# Patient Record
Sex: Male | Born: 1990 | Race: White | Hispanic: No | Marital: Single | Smoking: Never smoker
Health system: Southern US, Community
[De-identification: ages and names within clinical notes are randomized; demographics above are authoritative.]

## PROBLEM LIST (undated history)

## (undated) DIAGNOSIS — M6701 Short Achilles tendon (acquired), right ankle: Secondary | ICD-10-CM

## (undated) DIAGNOSIS — Z98811 Dental restoration status: Secondary | ICD-10-CM

## (undated) DIAGNOSIS — M76821 Posterior tibial tendinitis, right leg: Secondary | ICD-10-CM

## (undated) DIAGNOSIS — Q742 Other congenital malformations of lower limb(s), including pelvic girdle: Secondary | ICD-10-CM

## (undated) HISTORY — PX: NO PAST SURGERIES: SHX2092

---
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
## 2011-08-14 ENCOUNTER — Emergency Department (INDEPENDENT_AMBULATORY_CARE_PROVIDER_SITE_OTHER): Payer: Managed Care, Other (non HMO)

## 2011-08-14 ENCOUNTER — Emergency Department (INDEPENDENT_AMBULATORY_CARE_PROVIDER_SITE_OTHER)
Admission: EM | Admit: 2011-08-14 | Discharge: 2011-08-14 | Disposition: A | Discharge status: Home / Self Care | Payer: Managed Care, Other (non HMO) | Source: Home / Self Care | Attending: Emergency Medicine | Admitting: Emergency Medicine

## 2011-08-14 ENCOUNTER — Encounter (HOSPITAL_COMMUNITY): Payer: Self-pay

## 2011-08-14 DIAGNOSIS — S92253A Displaced fracture of navicular [scaphoid] of unspecified foot, initial encounter for closed fracture: Secondary | ICD-10-CM

## 2011-08-14 DIAGNOSIS — S92009A Unspecified fracture of unspecified calcaneus, initial encounter for closed fracture: Secondary | ICD-10-CM

## 2011-08-14 DIAGNOSIS — S92256A Nondisplaced fracture of navicular [scaphoid] of unspecified foot, initial encounter for closed fracture: Secondary | ICD-10-CM

## 2011-08-14 MED ORDER — HYDROCODONE-ACETAMINOPHEN 5-325 MG PO TABS: ORAL_TABLET | ORAL | Status: AC

## 2011-08-14 NOTE — Progress Notes (Signed)
Orthopedic Tech Progress Note Patient Details:  Paul Hooper 08/23/1990 [id number]  Type of Splint: Post (short) Splint Location: right leg Splint Interventions: Application    Nikki Dom 08/14/2011, 2:23 PM

## 2011-08-14 NOTE — ED Notes (Signed)
States  He twisted his right ankle while playing basketball yest PM,pain lateral aspect, worse w palpation, ROM or weight bearing; minimal swelling noted; good DP and post tibial pulses on palpation; used motrin x 1 last PM w minimal relief

## 2011-08-14 NOTE — Discharge Instructions (Signed)
Cast or Splint Care Casts and splints support injured limbs and keep bones from moving while they heal.  HOME CARE  Keep the cast or splint uncovered during the drying period.   A plaster cast can take 24 to 48 hours to dry.   A fiberglass cast will dry in less than 1 hour.   Do not rest the cast on anything harder than a pillow for 24 hours.   Do not put weight on your injured limb. Do not put pressure on the cast. Wait for your doctor's approval.   Keep the cast or splint dry.   Cover the cast or splint with a plastic bag during baths or wet weather.   If you have a cast over your chest and belly (trunk), take sponge baths until the cast is taken off.   Keep your cast or splint clean. Wash a dirty cast with a damp cloth.   Do not put any objects under your cast or splint. Do not scratch the skin under the cast with an object.   Do not take out the padding from inside your cast.   Exercise your joints near the cast as told by your doctor.   Raise (elevate) your injured limb on 1 or 2 pillows for the first 1 to 3 days.  GET HELP RIGHT AWAY IF:  Your cast or splint cracks.   Your cast or splint is too tight or too loose.   You itch badly under the cast.   Your cast gets wet or has a soft spot.   You have a bad smell coming from the cast.   You get an object stuck under the cast.   Your skin around the cast becomes red or raw.   You have new or more pain after the cast is put on.   You have fluid leaking through the cast.   You cannot move your fingers or toes.   Your fingers or toes turn colors or are cool, painful, or puffy (swollen).   You have tingling or lose feeling (numbness) around the injured area.   You have pain or pressure under the cast.   You have trouble breathing or have shortness of breath.   You have chest pain.  MAKE SURE YOU:  Understand these instructions.   Will watch your condition.   Will get help right away if you are not doing  well or get worse.  Document Released: 08/10/2010 Document Revised: 03/30/2011 Document Reviewed: 08/10/2010 [facility] Patient Information 2012 [place], [place].Avulsion Fracture You have an avulsion fracture. Avulsion fractures are chips of bone pulled off by muscle tendons or ligaments. Common avulsion fractures are on the hand and foot. Avulsion fractures can also involve the elbow, knee, hip, and pelvis. The diagnosis is usually made by X-ray or ultrasound exam. These fractures may cause a deformity if the growth plate of the bone is involved in growing children. A growth plate is an area near the end of the bone where the bone grows from. Avulsion fractures can take several weeks to heal. They need long-term protection and follow-up. Do not remove the splint, immobilizer, or cast that has been applied to treat your injury unless instructed to do so. This is the most important part of your treatment. Other measures for treating avulsion fractures may include:  Keeping the injured limb at rest and elevated as recommended by your caregiver. This reduces pain and swelling. Use pillows to rest and elevate your arm or leg at night.  Ice packs applied to your injury every 20 minutes while awake for the next 2 days or as directed.   Pain medications.  Avulsion fractures near joints may require rehabilitation. Rarely an avulsion fracture needs surgery to hold pieces together. Proper follow-up care is important. Call your caregiver for a follow-up appointment.  SEEK IMMEDIATE MEDICAL CARE IF:   You notice increasing pain or pressure in the injury.   The area becomes cold, numb, or pale.  Document Released: 05/18/2004 Document Revised: 03/30/2011 Document Reviewed: 07/13/2008 [facility] Patient Information 2012 [place], [place].Calcaneal Fracture A fracture is a break in the bone. The calcaneus is the large irregular bone in the foot that makes up the heel of the foot. This bone can be fractured in many  ways. There are many different ways of treating these fractures. There is not universal agreement on the treatment of these fractures and there is often more than one way of treating these fractures, all of which can be correct. TREATMENTS Calcaneal fractures can be treated with:  Immobilization, which means the fracture is casted as it is without changing the positions of the fracture (bone pieces) involved.   Closed reduction, in which the bones are manipulated back into position without opening the site of the fracture (break) using surgery.   ORIF (open reduction and internal fixation), in which the fracture site is opened and the bone pieces are fixed into place with some type of hardware (for example, screws, pins or plates).   Primary arthrodesis (fusion), which means that the joint has enough damage that a procedure is done as the first treatment which will leave the joint permanently stiff. This will decrease function, however usually will leave the joint pain free.  Your caregiver will discuss the type of fracture you have and the treatment that will be best for that problem. If surgery is the treatment of choice, the following is information for you to know and also let your caregiver know about prior to surgery.  LET YOUR CAREGIVERS KNOW ABOUT:  Allergies.   Medications taken including herbs, eye drops, over the counter medications, and creams.   Use of steroids (by mouth or creams).   Previous problems with anesthetics or novocaine.   A family history of anesthetic complication.   Possibility of pregnancy, if this applies.   History of blood clots (thrombophlebitis).   History of bleeding or blood problems.   Previous surgery.   Other health problems.  AFTER THE PROCEDURE After surgery, you will be taken to the recovery area where a nurse will watch and check your progress. Once you are awake, stable, and taking fluids well, barring other problems you will be allowed to  go home. Once home an ice pack applied to your operative site may help with discomfort and keep the swelling down. Elevate your foot above your heart for the first 7-10 days after surgery. Do this as much as possible. HOME CARE INSTRUCTIONS   Follow your caregiver's instructions as to activities, exercises, physical therapy, and driving a car. Do not drive a car until your caregiver specifically tells you it is safe to do so.   Use crutches as directed by your caregiver.   Daily exercise is helpful to prevent return of problems. Maintain strength and range of motion as instructed.   Only take over-the-counter or prescription medicines for pain, discomfort, or fever as directed by your caregiver.   Be certain to make all of your follow-up appointments. This is critical for optimal healing.  SEEK MEDICAL CARE IF:   Increased bleeding (more than a small spot) from the wound or from beneath your cast or splint.   Redness, swelling, or increasing pain in the wound or from beneath your cast or splint.   Pus coming from wound or from beneath your cast or splint.   An unexplained oral temperature above 102 F (38.9 C) develops.   A foul smell coming from the wound or dressing or from beneath your cast, splint or removable fracture boot.  SEEK IMMEDIATE MEDICAL CARE IF:   You develop a rash, have difficulty breathing, or have any problems which seem to be from an allergy.   You develop swelling or inability to move your foot or toes.   You develop tingling or numbness in your foot or toes.   Your foot or toes turn blue, pale or cold.   You develop severe pain in the area of your injury.  If you do not have a window in your cast for observing the wound, a discharge or minor bleeding may show up as a stain on the outside of your cast. Report these findings to your caregiver. If you have been given a removable fracture boot, a small amount of bleeding through the dressings is normal. Change  the dressings as instructed by your caregiver. MAKE SURE YOU:   Understand these instructions.   Will watch your condition.   Will get help right away if you are not doing well or get worse.  Document Released: 01/18/2005 Document Revised: 03/30/2011 Document Reviewed: 11/13/2007 [facility] Patient Information 2012 [place], [place].

## 2011-08-14 NOTE — ED Provider Notes (Signed)
Chief Complaint  Patient presents with  . Ankle Pain    History of Present Illness:   The patient is a 21 year old male who was playing basketball yesterday and twisted his right ankle. His ankle is been swollen, painful, and it hurts to walk. He is not able to ambulate right now. He denies any numbness or tingling.  Review of Systems:  Other than noted above, the patient denies any of the following symptoms: Systemic:  No fevers, chills, sweats, or aches.  No fatigue or tiredness. Musculoskeletal:  No joint pain, arthritis, bursitis, swelling, back pain, or neck pain. Neurological:  No muscular weakness, paresthesias, headache, or trouble with speech or coordination.  No dizziness.   PMFSH:  Past medical history, family history, social history, meds, and allergies were reviewed.  Physical Exam:   Vital signs:  BP 101/62  Pulse 89  Temp(Src) 98 F (36.7 C) (Oral)  Resp 14  SpO2 100% Gen:  Alert and oriented times 3.  In no distress. Musculoskeletal: There is pain to palpation over the medial malleolus, and over the dorsum of the foot. None over the lateral malleolus. The ankle as a full range of motion with slight pain. Ladona Ridgel told was negative. Anterior drawer sign was negative. Pulses were full. Sensation was intact. Otherwise, all joints had a full a ROM with no swelling, bruising or deformity.  No edema, pulses full. Extremities were warm and pink.  Capillary refill was brisk.  Skin:  Clear, warm and dry.  No rash. Neuro:  Alert and oriented times 3.  Muscle strength was normal.  Sensation was intact to light touch.   Radiology:  Dg Ankle Complete Right  08/14/2011  *RADIOLOGY REPORT*  Clinical Data: Injury with medial pain.  RIGHT ANKLE - COMPLETE 3+ VIEW  Comparison: None.  Findings: No definite evidence of an acute fracture. A faint linear density projects along the lateral aspect of the midfoot on the frontal view, and may be seen along the dorsal aspect of the midfoot on the  lateral view.  Finding may represent calcification or old injury.  IMPRESSION:  1.  No definite evidence of acute fracture. 2.  Question old injury along the dorsolateral aspect of the midfoot.  Please correlate clinically for point tenderness.  Original Report Authenticated By: Reyes Ivan, M.D.   Dg Foot Complete Right  08/14/2011  *RADIOLOGY REPORT*  Clinical Data: Pain secondary to a twisting injury playing basketball last night.  RIGHT FOOT COMPLETE - 3+ VIEW  Comparison: None.  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  There is a tiny avulsion from the lateral aspect of the distal calcaneus. There are also small avulsions from the dorsal aspect of the navicular.  There is adjacent soft tissue swelling at the articulation of the navicular with the cuneiforms.  IMPRESSION: Small acute avulsions from the calcaneus and dorsal aspect of the navicular.  Original Report Authenticated By: Gwynn Burly, M.D.   Course in Urgent Care Center:   He was put in a short leg splint with stirrup and posterior support. He was given crutches for ambulation.    Assessment:  The primary encounter diagnosis was Traumatic closed nondisplaced fracture of tarsal navicular. A diagnosis of Fracture of calcaneus was also pertinent to this visit.  Plan:   1.  The following meds were prescribed:   New Prescriptions   HYDROCODONE-ACETAMINOPHEN (NORCO) 5-325 MG PER TABLET    1 to 2 tabs every 4 to  6 hours as needed for pain.   2.  The patient was instructed in symptomatic care, including rest and activity, elevation, application of ice and compression.  Appropriate handouts were given. 3.  The patient was told to return if becoming worse in any way, if no better in 3 or 4 days, and given some red flag symptoms that would indicate earlier return.   4.  The patient was told to follow up with Dr. Francena Hanly in 3-4 days.   Reuben Likes, MD 08/14/11 1640

## 2011-08-31 ENCOUNTER — Other Ambulatory Visit: Payer: Self-pay | Admitting: Orthopedic Surgery

## 2011-08-31 DIAGNOSIS — M25579 Pain in unspecified ankle and joints of unspecified foot: Secondary | ICD-10-CM

## 2011-09-01 ENCOUNTER — Ambulatory Visit
Admission: RE | Admit: 2011-09-01 | Discharge: 2011-09-01 | Disposition: A | Discharge status: Home / Self Care | Payer: Managed Care, Other (non HMO) | Source: Ambulatory Visit | Attending: Orthopedic Surgery | Admitting: Orthopedic Surgery

## 2011-09-01 DIAGNOSIS — M25579 Pain in unspecified ankle and joints of unspecified foot: Secondary | ICD-10-CM

## 2012-10-23 IMAGING — CT CT ANKLE*R* W/O CM
2 of 4 series · 4 of 14 positions shown, 5 images · IV contrast (agent unspecified)
Comparison: Radiographs 08/14/2011.

CLINICAL DATA: Medial midfoot pain after injury 3 weeks ago.
Question navicular fracture.

CT OF THE RIGHT ANKLE WITH CONTRAST
TECHNIQUE: Multidetector CT imaging was performed following the
standard protocol during bolus administration of intravenous
contrast.

[Series 2: ankle/foot bone · axial · 0.39mm/px · z∈[-96,-43]mm · 2 of 64 slices shown, 3 images]
[im 22/64  soft-tissue]
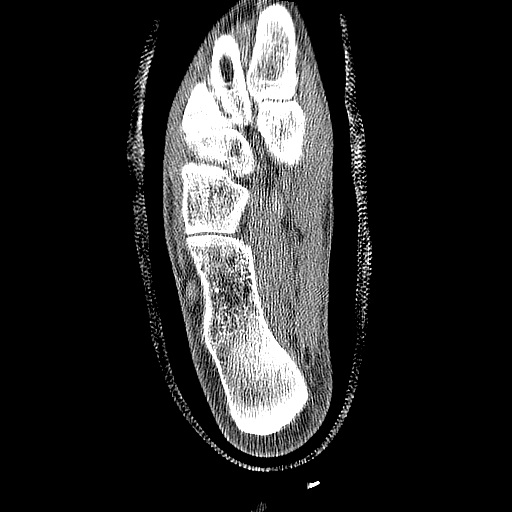
[im 22/64  bone]
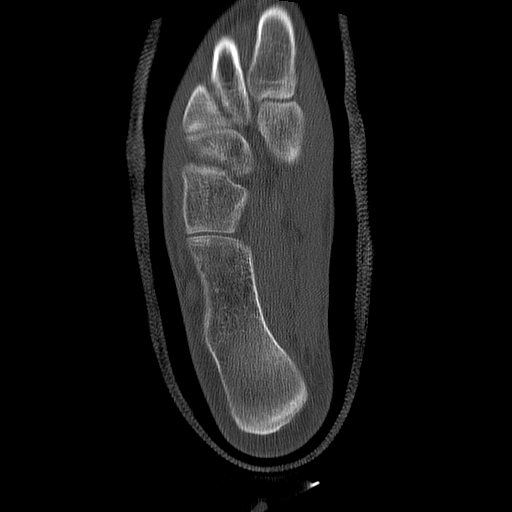
[im 43/64  bone]
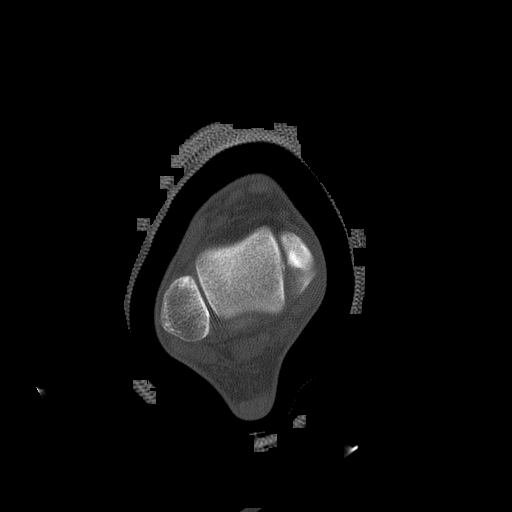

[Series 400: cor · coronal · 0.39mm/px · 2 of 85 slices shown]
[im 29/85  bone]
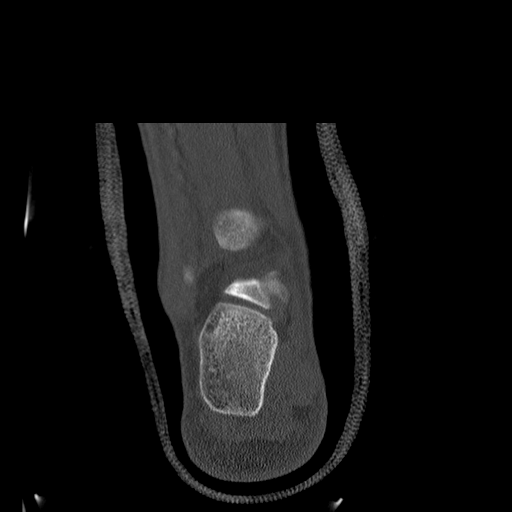
[im 57/85  bone]
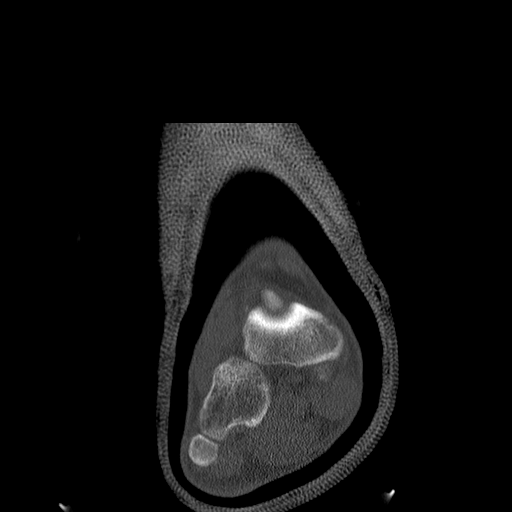

[4 of 14 positions shown; findings below may reference images not displayed]

FINDINGS: The ankle is casted.  As correlated with prior
radiographs, there is a type 2 accessory navicular with an
irregular synchondrosis with slightly sclerotic margins. There is
no displacement of the tuberosity.  Linear lucency within the
parent navicular adjacent to the synchondrosis on the sagittal
images (images 50 and 51 of series 401) could reflect a small
atypical fracture.

As noted on the radiographs, there is a tiny avulsion fracture from
the dorsal aspect of the navicular adjacent to the articulation
with the talus.  There are also several tiny avulsion fractures
from the anterior process of the calcaneus.

No other fractures are demonstrated.  The alignment is normal at
the Lisfranc joint.

Soft tissue windows demonstrate no tendon rupture. The posterior
tibialis tendon demonstrates no gross abnormality.
IMPRESSION: 1.  Type 2 accessory navicular.  This finding in itself can be
associated with medial ankle pain and posterior tibial tendon
dysfunction.
2.  Adjacent to the synchondrosis, there is atypical linear lucency
within the parent navicular which could reflect a nondisplaced
fracture.
3.  Small avulsion fractures from the dorsal aspect of the
navicular and anterolateral aspect of the calcaneus.

## 2015-12-27 ENCOUNTER — Ambulatory Visit (HOSPITAL_COMMUNITY)
Admission: EM | Admit: 2015-12-27 | Discharge: 2015-12-27 | Disposition: A | Discharge status: Home / Self Care | Payer: Managed Care, Other (non HMO) | Attending: Radiology | Admitting: Radiology

## 2015-12-27 ENCOUNTER — Encounter (HOSPITAL_COMMUNITY): Payer: Self-pay | Admitting: Emergency Medicine

## 2015-12-27 ENCOUNTER — Ambulatory Visit (INDEPENDENT_AMBULATORY_CARE_PROVIDER_SITE_OTHER): Payer: Managed Care, Other (non HMO)

## 2015-12-27 DIAGNOSIS — M25571 Pain in right ankle and joints of right foot: Secondary | ICD-10-CM

## 2015-12-27 MED ORDER — IBUPROFEN 800 MG PO TABS: 800.0000 mg | ORAL_TABLET | Freq: Three times a day (TID) | ORAL | 1 refills | Status: DC

## 2015-12-27 NOTE — ED Provider Notes (Signed)
[facility]    CSN: [id number] Arrival date & time: 12/27/15  1022  First Provider Contact:  First MD Initiated Contact with Patient 12/27/15 1312        History   Chief Complaint Chief Complaint  Patient presents with  . Ankle Pain    HPI Paul Hooper is a 25 y.o. male.   The history is provided by the patient.  Ankle Pain  Location:  Ankle Time since incident:  1 week Injury: no   Ankle location:  R ankle Pain details:    Quality:  Sharp   Severity:  Mild   Onset quality:  Gradual   Progression:  Unchanged Chronicity:  New Dislocation: no   Foreign body present:  No foreign bodies Prior injury to area:  Yes Relieved by:  Nothing Worsened by:  Nothing Ineffective treatments:  None tried Associated symptoms: decreased ROM and stiffness   Associated symptoms: no fever, no numbness and no swelling   Risk factors: frequent fractures     History reviewed. No pertinent past medical history.  There are no active problems to display for this patient.   History reviewed. No pertinent surgical history.     Home Medications    Prior to Admission medications   Not on File    Family History History reviewed. No pertinent family history.  Social History Social History  Substance Use Topics  . Smoking status: Never Smoker  . Smokeless tobacco: Not on file  . Alcohol use No     Allergies   Review of patient's allergies indicates no known allergies.   Review of Systems Review of Systems  Constitutional: Negative for fever.  Musculoskeletal: Positive for gait problem and stiffness. Negative for joint swelling.  Skin: Negative.      Physical Exam Triage Vital Signs ED Triage Vitals  Enc Vitals Group     BP 12/27/15 1121 (!) 118/101     Pulse Rate 12/27/15 1121 94     Resp 12/27/15 1121 18     Temp 12/27/15 1121 98.4 F (36.9 C)     Temp Source 12/27/15 1121 Oral     SpO2 12/27/15 1121 100 %     Weight --      Height --    Head Circumference --      Peak Flow --      Pain Score 12/27/15 1124 9     Pain Loc --      Pain Edu? --      Excl. in GC? --    No data found.   Updated Vital Signs BP (!) 118/101 (BP Location: Left Arm)   Pulse 94   Temp 98.4 F (36.9 C) (Oral)   Resp 18   SpO2 100%   Visual Acuity Right Eye Distance:   Left Eye Distance:   Bilateral Distance:    Right Eye Near:   Left Eye Near:    Bilateral Near:     Physical Exam  Constitutional: He is oriented to person, place, and time. He appears well-developed and well-nourished.  Musculoskeletal: Normal range of motion. He exhibits tenderness. He exhibits no deformity.  Neurological: He is alert and oriented to person, place, and time.  Pt asks if prev fx will show up.  Nursing note and vitals reviewed.    UC Treatments / Results  Labs (all labs ordered are listed, but only abnormal results are displayed) Labs Reviewed - No data to display  EKG  EKG Interpretation None  Radiology Dg Ankle Complete Right  Result Date: 12/27/2015 CLINICAL DATA:  Right ankle injury 1 week ago from kicking door 1 week ago. Right ankle pain. Initial encounter. EXAM: RIGHT ANKLE - COMPLETE 3+ VIEW COMPARISON:  08/14/2011 FINDINGS: There is no evidence of fracture, dislocation, or joint effusion. There is no evidence of arthropathy or other focal bone abnormality. Soft tissues are unremarkable. IMPRESSION: Negative. Electronically Signed   By: Myles RosenthalJohn  Stahl M.D.   On: 12/27/2015 12:10  X-rays reviewed and report per radiologist.   Procedures Procedures (including critical care time)  Medications Ordered in UC Medications - No data to display   Initial Impression / Assessment and Plan / UC Course  I have reviewed the triage vital signs and the nursing notes.  Pertinent labs & imaging results that were available during my care of the patient were reviewed by me and considered in my medical decision making (see chart for  details).  Clinical Course      Final Clinical Impressions(s) / UC Diagnoses   Final diagnoses:  None    New Prescriptions New Prescriptions   No medications on file     Linna HoffJames D Tresha Muzio, MD 12/27/15 1321

## 2015-12-27 NOTE — Discharge Instructions (Signed)
See your orthopedist if further problems. °

## 2015-12-27 NOTE — ED Triage Notes (Signed)
[facility] a [facility] right ankle pain secondary to a possible injury at work a week ago where he kicked out a door on a [employer].

## 2016-02-09 ENCOUNTER — Other Ambulatory Visit: Payer: Self-pay | Admitting: Orthopedic Surgery

## 2016-02-23 DIAGNOSIS — M6701 Short Achilles tendon (acquired), right ankle: Secondary | ICD-10-CM

## 2016-02-23 DIAGNOSIS — M76821 Posterior tibial tendinitis, right leg: Secondary | ICD-10-CM

## 2016-02-23 DIAGNOSIS — Q742 Other congenital malformations of lower limb(s), including pelvic girdle: Secondary | ICD-10-CM

## 2016-02-23 HISTORY — DX: Short Achilles tendon (acquired), right ankle: M67.01

## 2016-02-23 HISTORY — DX: Other congenital malformations of lower limb(s), including pelvic girdle: Q74.2

## 2016-02-23 HISTORY — DX: Posterior tibial tendinitis, right leg: M76.821

## 2016-02-25 ENCOUNTER — Encounter (HOSPITAL_BASED_OUTPATIENT_CLINIC_OR_DEPARTMENT_OTHER): Payer: Self-pay | Admitting: *Deleted

## 2016-03-02 ENCOUNTER — Encounter (HOSPITAL_BASED_OUTPATIENT_CLINIC_OR_DEPARTMENT_OTHER)
Admission: RE | Disposition: A | Discharge status: Home / Self Care | Payer: Self-pay | Source: Ambulatory Visit | Attending: Orthopedic Surgery

## 2016-03-02 ENCOUNTER — Ambulatory Visit (HOSPITAL_BASED_OUTPATIENT_CLINIC_OR_DEPARTMENT_OTHER): Payer: Managed Care, Other (non HMO) | Admitting: Certified Registered"

## 2016-03-02 ENCOUNTER — Ambulatory Visit (HOSPITAL_BASED_OUTPATIENT_CLINIC_OR_DEPARTMENT_OTHER)
Admission: RE | Admit: 2016-03-02 | Discharge: 2016-03-02 | Disposition: A | Discharge status: Home / Self Care | Payer: Managed Care, Other (non HMO) | Source: Ambulatory Visit | Attending: Orthopedic Surgery | Admitting: Orthopedic Surgery

## 2016-03-02 ENCOUNTER — Encounter (HOSPITAL_BASED_OUTPATIENT_CLINIC_OR_DEPARTMENT_OTHER): Payer: Self-pay | Admitting: Certified Registered"

## 2016-03-02 DIAGNOSIS — M76821 Posterior tibial tendinitis, right leg: Secondary | ICD-10-CM | POA: Diagnosis not present

## 2016-03-02 DIAGNOSIS — M6701 Short Achilles tendon (acquired), right ankle: Secondary | ICD-10-CM | POA: Diagnosis not present

## 2016-03-02 DIAGNOSIS — M778 Other enthesopathies, not elsewhere classified: Secondary | ICD-10-CM | POA: Diagnosis present

## 2016-03-02 HISTORY — DX: Posterior tibial tendinitis, right leg: M76.821

## 2016-03-02 HISTORY — PX: GASTROC RECESSION EXTREMITY: SHX6262

## 2016-03-02 HISTORY — PX: ANKLE RECONSTRUCTION: SHX1151

## 2016-03-02 HISTORY — DX: Short Achilles tendon (acquired), right ankle: M67.01

## 2016-03-02 HISTORY — DX: Other congenital malformations of lower limb(s), including pelvic girdle: Q74.2

## 2016-03-02 HISTORY — PX: EXCISION OF ACCESSORY OSSICLE: SHX6460

## 2016-03-02 HISTORY — DX: Dental restoration status: Z98.811

## 2016-03-02 SURGERY — EXCISION OF ACCESSORY OSSICLE
Anesthesia: Regional | Site: Leg Lower | Laterality: Right

## 2016-03-02 MED ORDER — 0.9 % SODIUM CHLORIDE (POUR BTL) OPTIME
TOPICAL | Status: DC | PRN
Start: 2016-03-02 — End: 2016-03-02
  Administered 2016-03-02: 250 mL

## 2016-03-02 MED ORDER — CHLORHEXIDINE GLUCONATE 4 % EX LIQD: 60.0000 mL | Freq: Once | CUTANEOUS | Status: DC

## 2016-03-02 MED ORDER — DOCUSATE SODIUM 100 MG PO CAPS: 100.0000 mg | ORAL_CAPSULE | Freq: Two times a day (BID) | ORAL | 0 refills | Status: AC

## 2016-03-02 MED ORDER — OXYCODONE HCL 5 MG PO TABS: 5.0000 mg | ORAL_TABLET | ORAL | 0 refills | Status: AC | PRN

## 2016-03-02 MED ORDER — SENNA 8.6 MG PO TABS: 2.0000 | ORAL_TABLET | Freq: Two times a day (BID) | ORAL | 0 refills | Status: AC

## 2016-03-02 MED ORDER — MIDAZOLAM HCL 2 MG/2ML IJ SOLN
INTRAMUSCULAR | Status: AC
  Filled 2016-03-02: qty 2

## 2016-03-02 MED ORDER — MIDAZOLAM HCL 2 MG/2ML IJ SOLN
1.0000 mg | INTRAMUSCULAR | Status: DC | PRN
  Administered 2016-03-02: 2 mg via INTRAVENOUS
  Administered 2016-03-02: 1 mg via INTRAVENOUS

## 2016-03-02 MED ORDER — ONDANSETRON HCL 4 MG/2ML IJ SOLN
INTRAMUSCULAR | Status: DC | PRN
  Administered 2016-03-02: 4 mg via INTRAVENOUS

## 2016-03-02 MED ORDER — LIDOCAINE 2% (20 MG/ML) 5 ML SYRINGE
INTRAMUSCULAR | Status: AC
  Filled 2016-03-02: qty 5

## 2016-03-02 MED ORDER — DEXAMETHASONE SODIUM PHOSPHATE 10 MG/ML IJ SOLN
INTRAMUSCULAR | Status: AC
  Filled 2016-03-02: qty 1

## 2016-03-02 MED ORDER — ONDANSETRON HCL 4 MG/2ML IJ SOLN
INTRAMUSCULAR | Status: AC
  Filled 2016-03-02: qty 2

## 2016-03-02 MED ORDER — ASPIRIN EC 81 MG PO TBEC: 81.0000 mg | DELAYED_RELEASE_TABLET | Freq: Two times a day (BID) | ORAL | 0 refills | Status: AC

## 2016-03-02 MED ORDER — CEFAZOLIN SODIUM-DEXTROSE 2-4 GM/100ML-% IV SOLN
INTRAVENOUS | Status: AC
  Filled 2016-03-02: qty 100

## 2016-03-02 MED ORDER — MEPERIDINE HCL 25 MG/ML IJ SOLN: 6.2500 mg | INTRAMUSCULAR | Status: DC | PRN

## 2016-03-02 MED ORDER — HYDROMORPHONE HCL 1 MG/ML IJ SOLN: 0.2500 mg | INTRAMUSCULAR | Status: DC | PRN

## 2016-03-02 MED ORDER — PROMETHAZINE HCL 25 MG/ML IJ SOLN
6.2500 mg | INTRAMUSCULAR | Status: DC | PRN
Start: 2016-03-02 — End: 2016-03-02

## 2016-03-02 MED ORDER — CEFAZOLIN SODIUM-DEXTROSE 2-4 GM/100ML-% IV SOLN
2.0000 g | INTRAVENOUS | Status: AC
  Administered 2016-03-02: 2 g via INTRAVENOUS

## 2016-03-02 MED ORDER — SCOPOLAMINE 1 MG/3DAYS TD PT72: 1.0000 | MEDICATED_PATCH | Freq: Once | TRANSDERMAL | Status: DC | PRN

## 2016-03-02 MED ORDER — PROPOFOL 10 MG/ML IV BOLUS
INTRAVENOUS | Status: DC | PRN
  Administered 2016-03-02: 200 mg via INTRAVENOUS

## 2016-03-02 MED ORDER — LACTATED RINGERS IV SOLN
INTRAVENOUS | Status: DC
  Administered 2016-03-02 (×2): via INTRAVENOUS

## 2016-03-02 MED ORDER — KETOROLAC TROMETHAMINE 30 MG/ML IJ SOLN: 30.0000 mg | Freq: Once | INTRAMUSCULAR | Status: DC | PRN

## 2016-03-02 MED ORDER — FENTANYL CITRATE (PF) 100 MCG/2ML IJ SOLN
INTRAMUSCULAR | Status: AC
  Filled 2016-03-02: qty 2

## 2016-03-02 MED ORDER — FENTANYL CITRATE (PF) 100 MCG/2ML IJ SOLN
50.0000 ug | INTRAMUSCULAR | Status: DC | PRN
  Administered 2016-03-02: 100 ug via INTRAVENOUS

## 2016-03-02 MED ORDER — OXYCODONE HCL 5 MG PO TABS: 5.0000 mg | ORAL_TABLET | Freq: Once | ORAL | Status: DC | PRN

## 2016-03-02 MED ORDER — ONDANSETRON HCL 4 MG PO TABS: 4.0000 mg | ORAL_TABLET | Freq: Every day | ORAL | 0 refills | Status: AC | PRN

## 2016-03-02 MED ORDER — BUPIVACAINE-EPINEPHRINE (PF) 0.5% -1:200000 IJ SOLN
INTRAMUSCULAR | Status: DC | PRN
  Administered 2016-03-02: 50 mL via PERINEURAL

## 2016-03-02 MED ORDER — SODIUM CHLORIDE 0.9 % IV SOLN: INTRAVENOUS | Status: DC

## 2016-03-02 MED ORDER — OXYCODONE HCL 5 MG/5ML PO SOLN: 5.0000 mg | Freq: Once | ORAL | Status: DC | PRN

## 2016-03-02 MED ORDER — DEXAMETHASONE SODIUM PHOSPHATE 10 MG/ML IJ SOLN
INTRAMUSCULAR | Status: DC | PRN
  Administered 2016-03-02: 10 mg via INTRAVENOUS

## 2016-03-02 SURGICAL SUPPLY — 78 items
ANCH SUT 1 SHRT SM RGD INSRTR (Anchor) ×2 IMPLANT
ANCHOR SUT 1.45 SZ 1 SHORT (Anchor) ×4 IMPLANT
BANDAGE ESMARK 6X9 LF (GAUZE/BANDAGES/DRESSINGS) ×2 IMPLANT
BLADE AVERAGE 25MMX9MM (BLADE) ×1
BLADE AVERAGE 25X9 (BLADE) ×3 IMPLANT
BLADE SURG 15 STRL LF DISP TIS (BLADE) ×4 IMPLANT
BLADE SURG 15 STRL SS (BLADE) ×8
BNDG CMPR 9X4 STRL LF SNTH (GAUZE/BANDAGES/DRESSINGS)
BNDG CMPR 9X6 STRL LF SNTH (GAUZE/BANDAGES/DRESSINGS) ×2
BNDG COHESIVE 4X5 TAN STRL (GAUZE/BANDAGES/DRESSINGS) ×4 IMPLANT
BNDG COHESIVE 6X5 TAN STRL LF (GAUZE/BANDAGES/DRESSINGS) ×4 IMPLANT
BNDG ESMARK 4X9 LF (GAUZE/BANDAGES/DRESSINGS) IMPLANT
BNDG ESMARK 6X9 LF (GAUZE/BANDAGES/DRESSINGS) ×4
CHLORAPREP W/TINT 26ML (MISCELLANEOUS) ×4 IMPLANT
COVER BACK TABLE 60X90IN (DRAPES) ×4 IMPLANT
CUFF TOURNIQUET SINGLE 34IN LL (TOURNIQUET CUFF) ×4 IMPLANT
DECANTER SPIKE VIAL GLASS SM (MISCELLANEOUS) IMPLANT
DRAPE EXTREMITY T 121X128X90 (DRAPE) ×4 IMPLANT
DRAPE OEC MINIVIEW 54X84 (DRAPES) ×3 IMPLANT
DRAPE U-SHAPE 47X51 STRL (DRAPES) ×4 IMPLANT
DRSG MEPITEL 4X7.2 (GAUZE/BANDAGES/DRESSINGS) ×4 IMPLANT
DRSG PAD ABDOMINAL 8X10 ST (GAUZE/BANDAGES/DRESSINGS) ×8 IMPLANT
ELECT REM PT RETURN 9FT ADLT (ELECTROSURGICAL) ×4
ELECTRODE REM PT RTRN 9FT ADLT (ELECTROSURGICAL) ×2 IMPLANT
GAUZE SPONGE 4X4 12PLY STRL (GAUZE/BANDAGES/DRESSINGS) ×4 IMPLANT
GLOVE BIO SURGEON STRL SZ8 (GLOVE) ×4 IMPLANT
GLOVE BIOGEL PI IND STRL 7.0 (GLOVE) ×1 IMPLANT
GLOVE BIOGEL PI IND STRL 8 (GLOVE) ×4 IMPLANT
GLOVE BIOGEL PI INDICATOR 7.0 (GLOVE) ×2
GLOVE BIOGEL PI INDICATOR 8 (GLOVE) ×4
GLOVE ECLIPSE 6.5 STRL STRAW (GLOVE) ×4 IMPLANT
GLOVE ECLIPSE 7.5 STRL STRAW (GLOVE) ×4 IMPLANT
GLOVE EXAM NITRILE MD LF STRL (GLOVE) ×4 IMPLANT
GOWN STRL REUS W/ TWL LRG LVL3 (GOWN DISPOSABLE) ×2 IMPLANT
GOWN STRL REUS W/ TWL XL LVL3 (GOWN DISPOSABLE) ×4 IMPLANT
GOWN STRL REUS W/TWL LRG LVL3 (GOWN DISPOSABLE) ×4
GOWN STRL REUS W/TWL XL LVL3 (GOWN DISPOSABLE) ×8
K-WIRE .062X4 (WIRE) IMPLANT
NDL HYPO 25X1 1.5 SAFETY (NEEDLE) IMPLANT
NDL SUT 6 .5 CRC .975X.05 MAYO (NEEDLE) IMPLANT
NEEDLE HYPO 22GX1.5 SAFETY (NEEDLE) IMPLANT
NEEDLE HYPO 25X1 1.5 SAFETY (NEEDLE) IMPLANT
NEEDLE MAYO TAPER (NEEDLE)
NS IRRIG 1000ML POUR BTL (IV SOLUTION) ×4 IMPLANT
PACK BASIN DAY SURGERY FS (CUSTOM PROCEDURE TRAY) ×4 IMPLANT
PAD CAST 4YDX4 CTTN HI CHSV (CAST SUPPLIES) ×2 IMPLANT
PADDING CAST ABS 4INX4YD NS (CAST SUPPLIES)
PADDING CAST ABS COTTON 4X4 ST (CAST SUPPLIES) IMPLANT
PADDING CAST COTTON 4X4 STRL (CAST SUPPLIES) ×4
PADDING CAST COTTON 6X4 STRL (CAST SUPPLIES) ×4 IMPLANT
PASSER SUT SWANSON 36MM LOOP (INSTRUMENTS) IMPLANT
PENCIL BUTTON HOLSTER BLD 10FT (ELECTRODE) ×4 IMPLANT
RETRIEVER SUT HEWSON (MISCELLANEOUS) IMPLANT
SANITIZER HAND PURELL 535ML FO (MISCELLANEOUS) ×4 IMPLANT
SHEET MEDIUM DRAPE 40X70 STRL (DRAPES) ×4 IMPLANT
SLEEVE SCD COMPRESS KNEE MED (MISCELLANEOUS) ×4 IMPLANT
SPLINT FAST PLASTER 5X30 (CAST SUPPLIES) ×40
SPLINT PLASTER CAST FAST 5X30 (CAST SUPPLIES) ×40 IMPLANT
SPONGE LAP 18X18 X RAY DECT (DISPOSABLE) ×4 IMPLANT
STOCKINETTE 6  STRL (DRAPES) ×2
STOCKINETTE 6 STRL (DRAPES) ×2 IMPLANT
SUCTION FRAZIER HANDLE 10FR (MISCELLANEOUS) ×2
SUCTION TUBE FRAZIER 10FR DISP (MISCELLANEOUS) ×2 IMPLANT
SUT ETHIBOND 0 MO6 C/R (SUTURE) IMPLANT
SUT ETHIBOND 2 OS 4 DA (SUTURE) IMPLANT
SUT ETHILON 3 0 PS 1 (SUTURE) ×4 IMPLANT
SUT FIBERWIRE 2-0 18 17.9 3/8 (SUTURE)
SUT MERSILENE 2.0 SH NDLE (SUTURE) IMPLANT
SUT MNCRL AB 3-0 PS2 18 (SUTURE) ×4 IMPLANT
SUT VIC AB 0 SH 27 (SUTURE) ×4 IMPLANT
SUT VIC AB 2-0 SH 27 (SUTURE)
SUT VIC AB 2-0 SH 27XBRD (SUTURE) IMPLANT
SUTURE FIBERWR 2-0 18 17.9 3/8 (SUTURE) IMPLANT
SYR BULB 3OZ (MISCELLANEOUS) ×4 IMPLANT
TOWEL OR 17X24 6PK STRL BLUE (TOWEL DISPOSABLE) ×8 IMPLANT
TUBE CONNECTING 20'X1/4 (TUBING) ×1
TUBE CONNECTING 20X1/4 (TUBING) ×3 IMPLANT
UNDERPAD 30X30 (UNDERPADS AND DIAPERS) ×4 IMPLANT

## 2016-03-02 NOTE — Anesthesia Postprocedure Evaluation (Signed)
Anesthesia Post Note  Patient: Paul Hooper  Procedure(s) Performed: Procedure(s) (LRB): EXCISION OF RIGHT ACCESSORY NAVICULAR (Right) RIGHT GASTROC RECESSION (Right) RECONSTRUCTION OF POSTERIOR TIBIAL TENDON (Right)  Patient location during evaluation: PACU Anesthesia Type: General and Regional Level of consciousness: sedated and patient cooperative Pain management: pain level controlled Vital Signs Assessment: post-procedure vital signs reviewed and stable Respiratory status: spontaneous breathing Cardiovascular status: stable Anesthetic complications: no    Last Vitals:  Vitals:   03/02/16 1023 03/02/16 1100  BP:  136/84  Pulse: 99 87  Resp: 18 18  Temp:  36.5 C    Last Pain:  Vitals:   03/02/16 1100  TempSrc:   PainSc: 0-No pain                 Lewie LoronJohn Sharhonda Atwood

## 2016-03-02 NOTE — H&P (Signed)
Paul Hooper is an 25 y.o. male.   Chief Complaint: right foot pain HPI: 25 y/o male with right foot medial pain for many months.  He has failed non op treatment and presents today for surgery.  Past Medical History:  Diagnosis Date  . 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 Dental crown present   . Tibial tendonitis, posterior, right 02/2016  . 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 NO PAST SURGERIES      History reviewed. No pertinent family history. Social History:  reports that he has never smoked. He has never used smokeless tobacco. He reports that he does not drink alcohol or use drugs.  Allergies:  Allergies  Allergen Reactions  . Tramadol Itching and Nausea Only    Medications Prior to Admission  Medication Sig Dispense Refill  . HYDROcodone-acetaminophen (NORCO/VICODIN) 5-325 MG tablet Take 1 tablet by mouth every 6 (six) hours as needed for moderate pain.      No results found for this or any previous visit (from the past 48 hour(s)). No results found.  ROS  No recent f/c/n/v/wt loss  Blood pressure 125/79, pulse 80, temperature 98 F (36.7 C), temperature source Oral, resp. rate 20, height 5\' 10"  (1.778 m), weight 78.9 kg (174 lb), SpO2 100 %. Physical Exam  wn wd male in nad.  A and O x 4.  Mood and affect normal.  EOMI.  resp unlabored.  R foot TTP at medial navicular.  Sens to LT intact at the ankle.  5/5 strength in PF, DF, inversion and eversion.  2+ DP and PT Pulses.  No lymphadenopathy.  Skin healthy and intact.  Assessment/Plan Painful right accessory navicular and tight right heelcord.  To OR for excision of accessory navicular, reconstruction of the posterior tibial tendon and possible gastroc recession.  The risks and benefits of the alternative treatment options have been discussed in detail.  The patient wishes to proceed with surgery and specifically understands risks of bleeding,  infection, nerve damage, blood clots, need for additional surgery, amputation and death.   Toni ArthursHEWITT, Fruma Africa, MD 03/02/2016, 8:25 AM

## 2016-03-02 NOTE — Anesthesia Preprocedure Evaluation (Addendum)
Anesthesia Evaluation  Patient identified by MRN, date of birth, ID band Patient awake    Reviewed: Allergy & Precautions, NPO status , Patient's Chart, lab work & pertinent test results  History of Anesthesia Complications (+) MALIGNANT HYPERTHERMIA  Airway Mallampati: I  TM Distance: >3 FB Neck ROM: Full    Dental no notable dental hx.    Pulmonary neg pulmonary ROS,    Pulmonary exam normal breath sounds clear to auscultation       Cardiovascular negative cardio ROS Normal cardiovascular exam Rhythm:Regular Rate:Normal     Neuro/Psych negative neurological ROS  negative psych ROS   GI/Hepatic negative GI ROS, Neg liver ROS,   Endo/Other  negative endocrine ROS  Renal/GU negative Renal ROS     Musculoskeletal negative musculoskeletal ROS (+)   Abdominal   Peds  Hematology negative hematology ROS (+)   Anesthesia Other Findings   Reproductive/Obstetrics                           Anesthesia Evaluation Anesthesia Physical Anesthesia Plan Anesthesia Quick Evaluation  Anesthesia Physical Anesthesia Plan  ASA: I  Anesthesia Plan: General and Regional   Post-op Pain Management: GA combined w/ Regional for post-op pain   Induction: Intravenous  Airway Management Planned: LMA  Additional Equipment:   Intra-op Plan:   Post-operative Plan: Extubation in OR  Informed Consent: I have reviewed the patients History and Physical, chart, labs and discussed the procedure including the risks, benefits and alternatives for the proposed anesthesia with the patient or authorized representative who has indicated his/her understanding and acceptance.   Dental advisory given  Plan Discussed with: CRNA  Anesthesia Plan Comments:        Anesthesia Quick Evaluation

## 2016-03-02 NOTE — Anesthesia Procedure Notes (Signed)
Procedure Name: LMA Insertion Date/Time: 03/02/2016 8:42 AM Performed by: Sharolyn Weber D Pre-anesthesia Checklist: Patient identified, Emergency Drugs available, Suction available and Patient being monitored Patient Re-evaluated:Patient Re-evaluated prior to inductionOxygen Delivery Method: Circle system utilized Preoxygenation: Pre-oxygenation with 100% oxygen Intubation Type: IV induction Ventilation: Mask ventilation without difficulty LMA: LMA inserted LMA Size: 4.0 Number of attempts: 1 Airway Equipment and Method: Bite block Placement Confirmation: positive ETCO2 Tube secured with: Tape Dental Injury: Teeth and Oropharynx as per pre-operative assessment

## 2016-03-02 NOTE — Brief Op Note (Signed)
03/02/2016  10:07 AM  PATIENT:  Paul Hooper  25 y.o. male  PRE-OPERATIVE DIAGNOSIS: 1.  Painful right accessory navicular      2.  Tight right heelcord  POST-OPERATIVE DIAGNOSIS:  same  Procedure(s): 1.  EXCISION OF RIGHT ACCESSORY NAVICULAR and advancement of posterior tibial tendon 2.  RIGHT GASTROC RECESSION (separate incision) 3.  AP and lateral xrays of the right foot  SURGEON:  Toni ArthursJohn Zylen Wenig, MD  ASSISTANT: Alfredo MartinezJustin Ollis, PA-C  ANESTHESIA:   General, regional  EBL:  minimal   TOURNIQUET:   Total Tourniquet Time Documented: Thigh (Right) - 44 minutes Total: Thigh (Right) - 44 minutes  COMPLICATIONS:  None apparent  DISPOSITION:  Extubated, awake and stable to recovery.  DICTATION ID:  [id number]

## 2016-03-02 NOTE — Anesthesia Procedure Notes (Addendum)
Anesthesia Regional Block:  Popliteal block  Pre-Anesthetic Checklist: ,, timeout performed, Correct Patient, Correct Site, Correct Laterality, Correct Procedure, Correct Position, site marked, Risks and benefits discussed,  Surgical consent,  Pre-op evaluation,  At surgeon's request and post-op pain management  Laterality: Right  Prep: chloraprep       Needles:  Injection technique: Single-shot  Needle Type: Stimiplex     Needle Length: 10cm 10 cm Needle Gauge: 21 and 21 G    Additional Needles:  Procedures: ultrasound guided (picture in chart) and nerve stimulator  Motor weakness within 5 minutes. Popliteal block  Nerve Stimulator or Paresthesia:  Response: Plantar flexion/toe flexion, 0.5 mA,   Additional Responses:   Narrative:  Start time: 03/02/2016 8:16 AM End time: 03/02/2016 8:22 AM Injection made incrementally with aspirations every 5 mL.  Performed by: Personally  Anesthesiologist: Lewie LoronGERMEROTH, Taiya Nutting  Additional Notes: Nerve located and needle positioned with direct ultrasound guidance. Good perineural spread. Patient tolerated well.

## 2016-03-02 NOTE — Transfer of Care (Signed)
Immediate Anesthesia Transfer of Care Note  Patient: Paul Hooper  Procedure(s) Performed: Procedure(s): EXCISION OF RIGHT ACCESSORY NAVICULAR (Right) RIGHT GASTROC RECESSION (Right) RECONSTRUCTION OF POSTERIOR TIBIAL TENDON (Right)  Patient Location: PACU  Anesthesia Type:GA combined with regional for post-op pain  Level of Consciousness: awake and patient cooperative  Airway & Oxygen Therapy: Patient Spontanous Breathing and Patient connected to face mask oxygen  Post-op Assessment: Report given to RN and Post -op Vital signs reviewed and stable  Post vital signs: Reviewed and stable  Last Vitals:  Vitals:   03/02/16 0815 03/02/16 0820  BP: 130/81 116/69  Pulse: 98 88  Resp: 11 13  Temp:      Last Pain:  Vitals:   03/02/16 0713  TempSrc: Oral  PainSc: 5          Complications: No apparent anesthesia complications

## 2016-03-02 NOTE — Progress Notes (Signed)
Assisted Dr. Germeroth with right, ultrasound guided, popliteal/saphenous block. Side rails up, monitors on throughout procedure. See vital signs in flow sheet. Tolerated Procedure well. 

## 2016-03-02 NOTE — Discharge Instructions (Addendum)
°Post Anesthesia Home Care Instructions ° °Activity: °Get plenty of rest for the remainder of the day. A responsible adult should stay with you for 24 hours following the procedure.  °For the next 24 hours, DO NOT: °-Drive a car °-Operate machinery °-Drink alcoholic beverages °-Take any medication unless instructed by your physician °-Make any legal decisions or sign important papers. ° °Meals: °Start with liquid foods such as gelatin or soup. Progress to regular foods as tolerated. Avoid greasy, spicy, heavy foods. If nausea and/or vomiting occur, drink only clear liquids until the nausea and/or vomiting subsides. Call your physician if vomiting continues. ° °Special Instructions/Symptoms: °Your throat may feel dry or sore from the anesthesia or the breathing tube placed in your throat during surgery. If this causes discomfort, gargle with warm salt water. The discomfort should disappear within 24 hours. ° °If you had a scopolamine patch placed behind your ear for the management of post- operative nausea and/or vomiting: ° °1. The medication in the patch is effective for 72 hours, after which it should be removed.  Wrap patch in a tissue and discard in the trash. Wash hands thoroughly with soap and water. °2. You may remove the patch earlier than 72 hours if you experience unpleasant side effects which may include dry mouth, dizziness or visual disturbances. °3. Avoid touching the patch. Wash your hands with soap and water after contact with the patch. °  ° ° ° ° °Regional Anesthesia Blocks ° °1. Numbness or the inability to move the "blocked" extremity may last from 3-48 hours after placement. The length of time depends on the medication injected and your individual response to the medication. If the numbness is not going away after 48 hours, call your surgeon. ° °2. The extremity that is blocked will need to be protected until the numbness is gone and the  Strength has returned. Because you cannot feel it, you  will need to take extra care to avoid injury. Because it may be weak, you may have difficulty moving it or using it. You may not know what position it is in without looking at it while the block is in effect. ° °3. For blocks in the legs and feet, returning to weight bearing and walking needs to be done carefully. You will need to wait until the numbness is entirely gone and the strength has returned. You should be able to move your leg and foot normally before you try and bear weight or walk. You will need someone to be with you when you first try to ensure you do not fall and possibly risk injury. ° °4. Bruising and tenderness at the needle site are common side effects and will resolve in a few days. ° °5. Persistent numbness or new problems with movement should be communicated to the surgeon or the [facility] ([phone])/ [facility] ([phone]). ° ° ° ° °John Hewitt, MD °[facility] ° °Please read the following information regarding your care after surgery. ° °Medications  °You only need a prescription for the narcotic pain medicine (ex. oxycodone, Percocet, Norco).  All of the other medicines listed below are available over the counter. °X acetominophen (Tylenol) 650 mg every 4-6 hours as you need for minor pain °X oxycodone as prescribed for moderate to severe pain °?  ° °Narcotic pain medicine (ex. oxycodone, Percocet, Vicodin) will cause constipation.  To prevent this problem, take the following medicines while you are taking any pain medicine. °X docusate sodium (Colace)   100 mg twice a day X senna (Senokot) 2 tablets twice a day ° °X To help prevent blood clots, take a baby aspirin (81 mg) twice a day after surgery until you are allowed to bear weight on your operative extremity.  You should also get up every hour while you are awake to move around.   ° °Weight Bearing °X Do not bear any weight on the operated leg or foot. ° °Cast / Splint / Dressing °X Keep your  splint or cast clean and dry.  Don’t put anything (coat hanger, pencil, etc) down inside of it.  If it gets damp, use a hair dryer on the cool setting to dry it.  If it gets soaked, call the office to schedule an appointment for a cast change. °  ° °After your dressing, cast or splint is removed; you may shower, but do not soak or scrub the wound.  Allow the water to run over it, and then gently pat it dry. ° °Swelling °It is normal for you to have swelling where you had surgery.  To reduce swelling and pain, keep your toes above your nose for at least 3 days after surgery.  It may be necessary to keep your foot or leg elevated for several weeks.  If it hurts, it should be elevated. ° °Follow Up °Call my office at [phone] when you are discharged from the hospital or surgery center to schedule an appointment to be seen two weeks after surgery. ° °Call my office at [phone] if you develop a fever >101.5° F, nausea, vomiting, bleeding from the surgical site or severe pain.   ° ° °

## 2016-03-02 NOTE — Anesthesia Procedure Notes (Addendum)
Anesthesia Regional Block:  Adductor canal block  Pre-Anesthetic Checklist: ,, timeout performed, Correct Patient, Correct Site, Correct Laterality, Correct Procedure, Correct Position, site marked, Risks and benefits discussed,  Surgical consent,  Pre-op evaluation,  At surgeon's request and post-op pain management  Laterality: Right  Prep: chloraprep       Needles:  Injection technique: Single-shot  Needle Type: Stimiplex     Needle Length: 9cm 9 cm Needle Gauge: 21 and 21 G    Additional Needles:  Procedures: ultrasound guided (picture in chart) Adductor canal block Narrative:  Start time: 03/02/2016 8:10 AM End time: 03/02/2016 8:16 AM Injection made incrementally with aspirations every 5 mL.  Performed by: Personally  Anesthesiologist: Lewie LoronGERMEROTH, Maytal Mijangos  Additional Notes: BP cuff, EKG monitors applied. Sedation begun. Artery and nerve location verified with U/S and anesthetic injected incrementally, slowly, and after negative aspirations under direct u/s guidance. Good fascial /perineural spread. Tolerated well.

## 2016-03-03 ENCOUNTER — Encounter (HOSPITAL_BASED_OUTPATIENT_CLINIC_OR_DEPARTMENT_OTHER): Payer: Self-pay | Admitting: Orthopedic Surgery

## 2016-03-03 NOTE — Op Note (Signed)
NAMCam Hai:  Lepore, Eddi            ACCOUNT NO.:  [id number]  MEDICAL RECORD NO.:  [id number]  LOCATION:                                 FACILITY:  PHYSICIAN:  Toni ArthursJohn Navaeh Kehres, MD        DATE OF BIRTH:  1990/07/08  DATE OF PROCEDURE:  03/02/2016 DATE OF DISCHARGE:                              OPERATIVE REPORT   PREOPERATIVE DIAGNOSES: 1. Painful right accessory navicular. 2. Tight right heel cord.  POSTOPERATIVE DIAGNOSES: 1. Painful right accessory navicular. 2. Tight right heel cord.  PROCEDURE: 1. Excision of right accessory navicular and advancement of the     posterior tibial tendon. 2. Right gastrocnemius recession through a separate incision. 3. AP and lateral radiographs of the right foot.  SURGEON:  Toni ArthursJohn Hadiyah Maricle, M.D.  ASSISTANT:  Alfredo MartinezJustin Ollis, PA-C.  ANESTHESIA:  General, regional.  ESTIMATED BLOOD LOSS:  Minimal.  TOURNIQUET TIME:  44 minutes at 250 mmHg.  COMPLICATIONS:  None apparent.  DISPOSITION:  Extubated, awake, and stable to recovery.  INDICATIONS FOR PROCEDURE:  The patient is a 25 year old male with a history of right medial foot pain.  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  He has failed nonoperative treatment to date and presents today for excision of the accessory navicular and reconstruction of the posterior tibial tendon.  He also has a tight right heel cord and may need a gastrocnemius recession.  He understands the risks and benefits of the alternative treatment options and elects surgical treatment.  He specifically understands risks of bleeding, infection, nerve damage, blood clots, need for additional surgery, continued pain, nonunion, amputation, and death.  PROCEDURE IN DETAIL:  After preoperative consent was obtained and the correct operative site was identified, the patient was brought to the operating room and placed supine on the operating table.  General anesthesia was induced.  Preoperative  antibiotics were administered. Surgical time-out was taken.  The right lower extremity was prepped and draped in standard sterile fashion with tourniquet around the thigh. The extremity was exsanguinated and the tourniquet was inflated to 250 mmHg.  The patient's calf was examined and the gastrocnemius was noted to be focally contracted.  Decision was made to proceed with a gastrocnemius recession.  A longitudinal incision was made over the medial calf.  Sharp dissection was carried down through the skin and subcutaneous tissue.  Superficial fascia was incised.  The gastrocnemius tendon was identified.  The sural nerve was protected, and the tendon was divided from medial to lateral under direct vision.  The wound was irrigated and closed with Monocryl and nylon.  Attention was then turned to the medial hindfoot where a longitudinal incision was made over the navicular and the course of the posterior tibial tendon.  Sharp dissection was carried down through the skin and subcutaneous tissue.  The accessory navicular was identified.  It was dissected in subperiosteal fashion from the posterior tibial tendon. The synchondrosis was separated with an osteotome.  The accessory navicular was then excised in its entirety.  An oscillating saw was then used to trim the overhanging bone of the residual navicular.  Curettes and rongeurs were used to decorticate the bone  at the synchondrosis.  A guide pin was then inserted for a [employer]omet JuggerKnot.  AP and lateral radiograph showed appropriate alignment of the guide pin.  The guide pin was removed and 1.45 mm JuggerKnot was inserted and impacted into position.  It was noted to have excellent purchase.  It was then used to repair the posterior tibial tendon down to the decorticated bone with horizontal mattress sutures.  The tendon was then repaired to the adjacent periosteum with 0 Vicryl figure-of-eight sutures.  It was further attached to the distal  edge of the spring ligament, again with horizontal mattress sutures of 0 Vicryl.  The wound was then irrigated copiously.  The subcutaneous tissues were approximated with Monocryl, and the skin incision was closed with nylon.  Sterile dressings were applied followed by a well-padded short-leg splint.  Tourniquet was released after application of the dressings at 44 minutes.  The patient was awakened from anesthesia and transported to the recovery room in stable condition.  FOLLOWUP PLAN:  The patient may nonweightbearing on the right lower extremity.  He will take aspirin for DVT prophylaxis and follow up with me in the office in 2 weeks for suture removal.  Alfredo MartinezJustin Ollis PA-C, was present and scrubbed for the duration of the case.  His assistance was essential in positioning the patient, prepping and draping, gaining and maintaining exposure, performing the operation, closing and dressing the wounds, and applying the splint.  X-RAYS:  AP and lateral radiographs of the right foot were obtained intraoperatively.  These show complete excision of the accessory navicular and appropriate position of the guide pin for the suture anchor.     Toni ArthursJohn Wendelyn Kiesling, MD     JH/MEDQ  D:  03/02/2016  T:  03/03/2016  Job:  [id number]

## 2016-04-10 NOTE — Addendum Note (Signed)
Addendum  created 04/10/16 0953 by Lewie LoronJohn Tvisha Schwoerer, MD   Anesthesia Intra Blocks edited, Sign clinical note

## 2016-10-03 DIAGNOSIS — H6122 Impacted cerumen, left ear: Secondary | ICD-10-CM | POA: Diagnosis not present

## 2016-12-27 DIAGNOSIS — R202 Paresthesia of skin: Secondary | ICD-10-CM | POA: Diagnosis not present

## 2016-12-27 DIAGNOSIS — R5383 Other fatigue: Secondary | ICD-10-CM | POA: Diagnosis not present

## 2017-02-17 IMAGING — DX DG ANKLE COMPLETE 3+V*R*
3 series · 3 of 3 positions shown · non-contrast
Comparison: 08/14/2011

CLINICAL DATA: Right ankle injury 1 week ago from kicking door 1
week ago. Right ankle pain. Initial encounter.

EXAM:
RIGHT ANKLE - COMPLETE 3+ VIEW

[ankle ap]
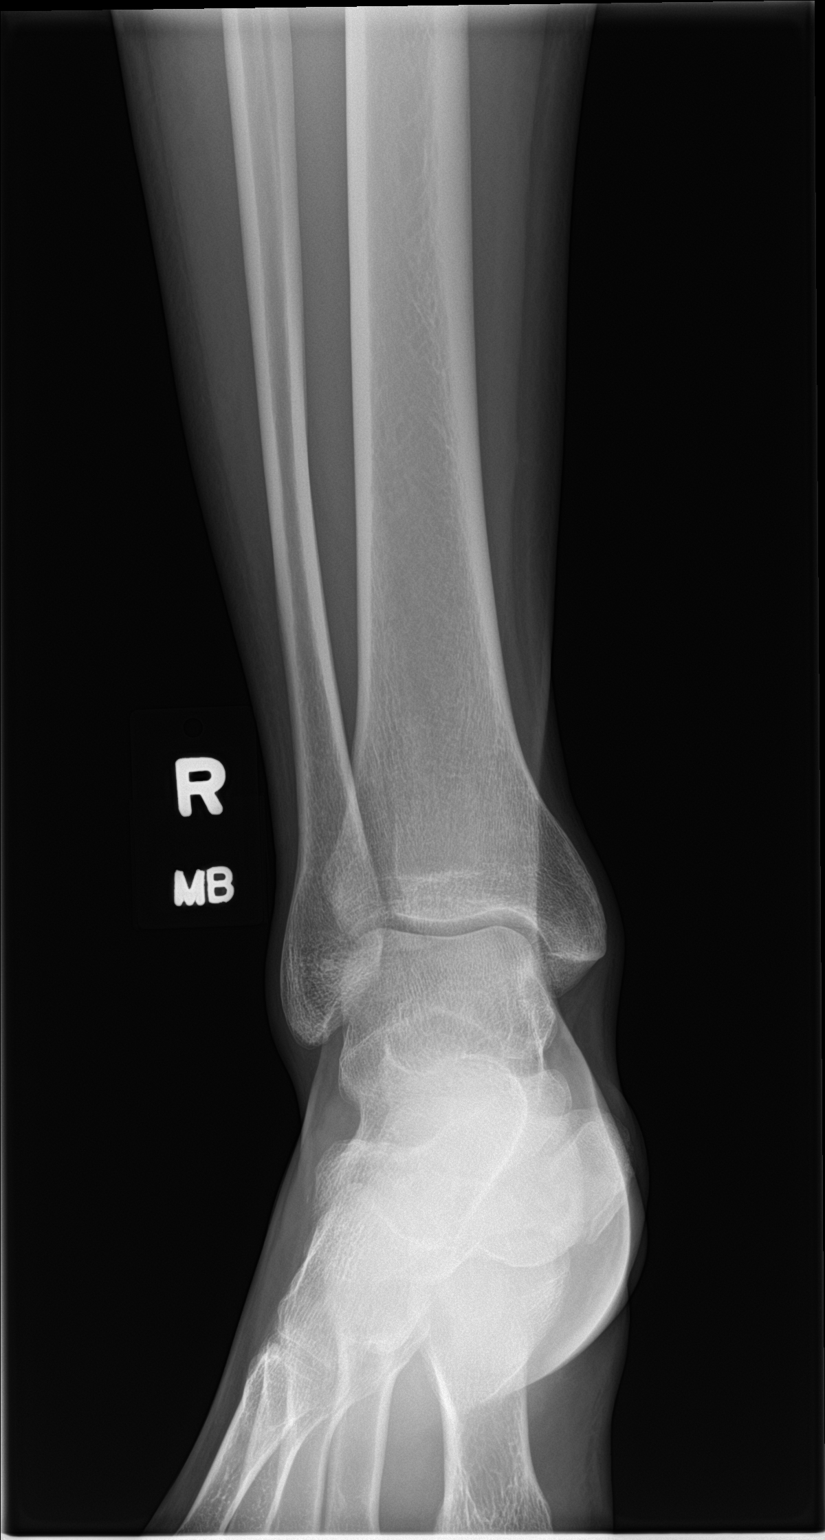

[ankle obl]
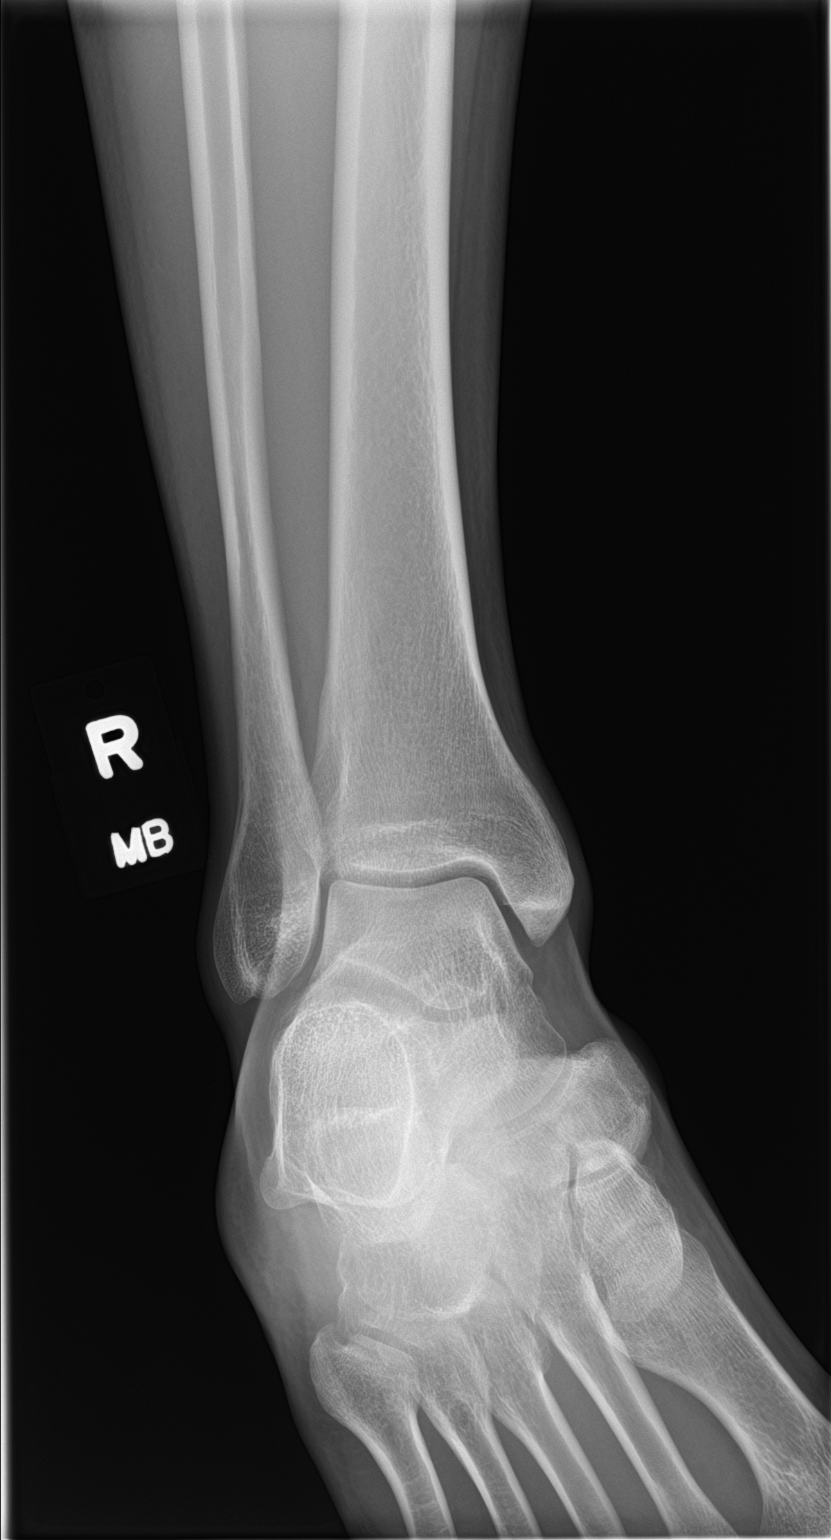

[ankle lat]
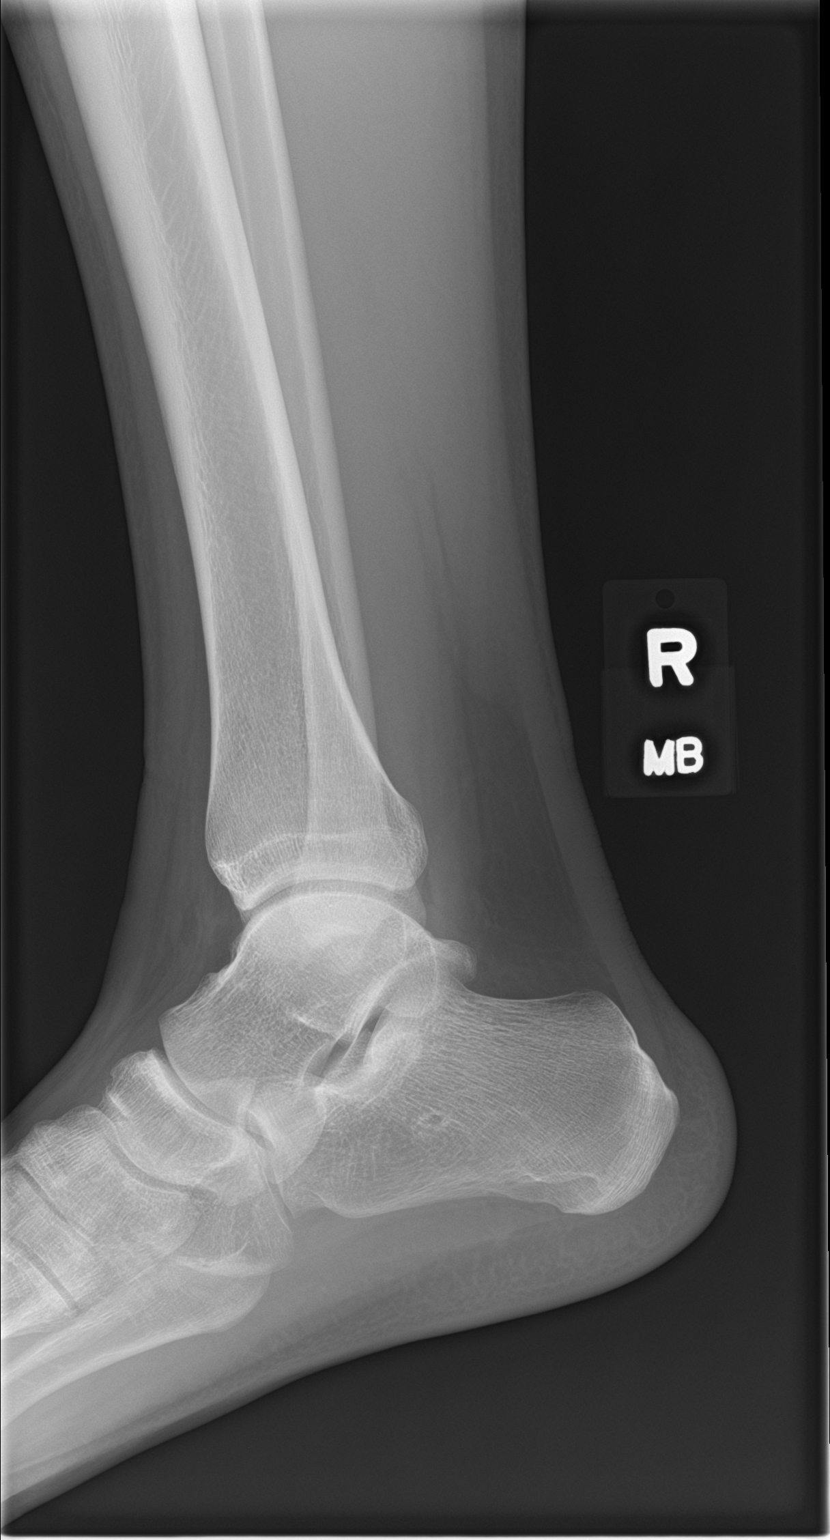

[3 of 3 positions shown; findings below may reference images not displayed]

FINDINGS: There is no evidence of fracture, dislocation, or joint effusion.
There is no evidence of arthropathy or other focal bone abnormality.
Soft tissues are unremarkable.
IMPRESSION: Negative.

## 2017-03-01 DIAGNOSIS — L309 Dermatitis, unspecified: Secondary | ICD-10-CM | POA: Diagnosis not present

## 2017-03-01 DIAGNOSIS — L810 Postinflammatory hyperpigmentation: Secondary | ICD-10-CM | POA: Diagnosis not present

## 2017-03-01 DIAGNOSIS — L858 Other specified epidermal thickening: Secondary | ICD-10-CM | POA: Diagnosis not present

## 2017-06-22 DIAGNOSIS — R945 Abnormal results of liver function studies: Secondary | ICD-10-CM | POA: Diagnosis not present

## 2017-06-22 DIAGNOSIS — G4700 Insomnia, unspecified: Secondary | ICD-10-CM | POA: Diagnosis not present

## 2017-06-22 DIAGNOSIS — R7989 Other specified abnormal findings of blood chemistry: Secondary | ICD-10-CM | POA: Diagnosis not present

## 2017-06-22 DIAGNOSIS — R079 Chest pain, unspecified: Secondary | ICD-10-CM | POA: Diagnosis not present

## 2017-06-22 DIAGNOSIS — G4710 Hypersomnia, unspecified: Secondary | ICD-10-CM | POA: Diagnosis not present

## 2017-06-22 DIAGNOSIS — E559 Vitamin D deficiency, unspecified: Secondary | ICD-10-CM | POA: Diagnosis not present
